# Patient Record
Sex: Male | Born: 1950 | Race: Black or African American | Hispanic: No | Marital: Married | Smoking: Never smoker
Health system: Southern US, Community
[De-identification: ages and names within clinical notes are randomized; demographics above are authoritative.]

## PROBLEM LIST (undated history)

## (undated) DIAGNOSIS — E7800 Pure hypercholesterolemia, unspecified: Secondary | ICD-10-CM

## (undated) DIAGNOSIS — E119 Type 2 diabetes mellitus without complications: Secondary | ICD-10-CM

## (undated) HISTORY — PX: ACHILLES TENDON REPAIR: SUR1153

---

## 2015-07-12 ENCOUNTER — Encounter (HOSPITAL_COMMUNITY): Payer: Self-pay

## 2015-07-12 DIAGNOSIS — E119 Type 2 diabetes mellitus without complications: Secondary | ICD-10-CM | POA: Diagnosis not present

## 2015-07-12 DIAGNOSIS — R109 Unspecified abdominal pain: Secondary | ICD-10-CM | POA: Insufficient documentation

## 2015-07-12 LAB — URINALYSIS, ROUTINE W REFLEX MICROSCOPIC
BILIRUBIN URINE: NEGATIVE
Glucose, UA: 500 mg/dL — AB
HGB URINE DIPSTICK: NEGATIVE
KETONES UR: NEGATIVE mg/dL
Leukocytes, UA: NEGATIVE
NITRITE: NEGATIVE
PROTEIN: NEGATIVE mg/dL
SPECIFIC GRAVITY, URINE: 1.022 (ref 1.005–1.030)
pH: 5 (ref 5.0–8.0)

## 2015-07-12 NOTE — ED Notes (Signed)
Pt has right flank pain and it has been bothering him for a week now. Denies any pain with urination. Wants to get it checked out before it gets worse. Denies any hx of kidney stone.

## 2015-07-12 NOTE — ED Notes (Signed)
Formatting of this note might be different from the original.  Pt has right flank pain and it has been bothering him for a week now. Denies any pain with urination. Wants to get it checked out before it gets worse. Denies any hx of kidney stone.   Electronically signed by Mylinda Latinaingley, Hayley J, RN at 07/12/2015 10:52 PM EST

## 2015-07-13 ENCOUNTER — Emergency Department (HOSPITAL_COMMUNITY): Payer: Managed Care, Other (non HMO)

## 2015-07-13 ENCOUNTER — Emergency Department (HOSPITAL_COMMUNITY)
Admission: EM | Admit: 2015-07-13 | Discharge: 2015-07-13 | Disposition: A | Discharge status: Home / Self Care | Payer: Managed Care, Other (non HMO) | Attending: Emergency Medicine | Admitting: Emergency Medicine

## 2015-07-13 ENCOUNTER — Encounter (HOSPITAL_COMMUNITY): Payer: Self-pay | Admitting: Radiology

## 2015-07-13 DIAGNOSIS — R109 Unspecified abdominal pain: Secondary | ICD-10-CM

## 2015-07-13 HISTORY — DX: Type 2 diabetes mellitus without complications: E11.9

## 2015-07-13 HISTORY — DX: Pure hypercholesterolemia, unspecified: E78.00

## 2015-07-13 LAB — I-STAT CHEM 8, ED
BUN: 16 mg/dL (ref 6–20)
CHLORIDE: 101 mmol/L (ref 101–111)
CREATININE: 0.8 mg/dL (ref 0.61–1.24)
Calcium, Ion: 1.23 mmol/L (ref 1.13–1.30)
Glucose, Bld: 167 mg/dL — ABNORMAL HIGH (ref 65–99)
HEMATOCRIT: 46 % (ref 39.0–52.0)
Hemoglobin: 15.6 g/dL (ref 13.0–17.0)
Potassium: 4 mmol/L (ref 3.5–5.1)
Sodium: 141 mmol/L (ref 135–145)
TCO2: 28 mmol/L (ref 0–100)

## 2015-07-13 MED ORDER — NAPROXEN 500 MG PO TABS: 500.0000 mg | ORAL_TABLET | Freq: Two times a day (BID) | ORAL | Status: AC

## 2015-07-13 NOTE — ED Provider Notes (Signed)
Formatting of this note is different from the original.  CSN: [id number]     Arrival date & time 07/12/15  2234  History    First MD Initiated Contact with Patient 07/13/15 0359      Chief Complaint   Patient presents with   ? Flank Pain     (Consider location/radiation/quality/duration/timing/severity/associated sxs/prior  Treatment)  HPI    This is a 65 year old male with history of diabetes and hyperlipidemia who presents with right flank pain. Patient reports onset of right flank pain 1 week ago. He states that the pain comes and goes. It is achy. It is nonradiating. Currently his pain is 8 out of 10. Nothing seems to make the pain better or worse. He denies any dysuria or hematuria. He denies any nausea, vomiting, abdominal pain, chest pain. He denies any other symptoms. No history of similar pain. No history of kidney stones.    Past Medical History   Diagnosis Date   ? Diabetes mellitus without complication (HCC)    ? High cholesterol      Past Surgical History   Procedure Laterality Date   ? Achilles tendon repair Left      No family history on file.  Social History   Substance Use Topics   ? Smoking status: Never Smoker    ? Smokeless tobacco: None   ? Alcohol Use: No     Review of Systems   Constitutional: Negative for fever.   Respiratory: Negative for chest tightness and shortness of breath.    Cardiovascular: Negative for chest pain.   Gastrointestinal: Negative for nausea, vomiting and abdominal pain.   Genitourinary: Positive for flank pain. Negative for hematuria.   All other systems reviewed and are negative.    Allergies   Sulfa antibiotics    Home Medications     Prior to Admission medications    Medication Sig Start Date End Date Taking? Authorizing Provider   naproxen (NAPROSYN) 500 MG tablet Take 1 tablet (500 mg total) by mouth 2 (two) times daily. 07/13/15   Shon Baton, MD     BP 114/75 mmHg  Pulse 93  Temp(Src) 98 F (36.7 C) (Oral)  Resp 18  SpO2 100%  Physical Exam    Constitutional: He is oriented to person, place, and time. He appears well-developed and well-nourished. No distress.   HENT:   Head: Normocephalic and atraumatic.   Cardiovascular: Normal rate, regular rhythm and normal heart sounds.    No murmur heard.  Pulmonary/Chest: Effort normal and breath sounds normal. No respiratory distress. He has no wheezes.   Abdominal: Soft. Bowel sounds are normal. There is no tenderness. There is no rebound.   Genitourinary:   No CVA tenderness   Musculoskeletal: He exhibits no edema.   Neurological: He is alert and oriented to person, place, and time.   Skin: Skin is warm and dry.   Psychiatric: He has a normal mood and affect.   Nursing note and vitals reviewed.    ED Course   Procedures (including critical care time)  Labs Review  Labs Reviewed   URINALYSIS, ROUTINE W REFLEX MICROSCOPIC (NOT AT [facility]) - Abnormal; Notable for the following:     Glucose, UA 500 (*)     All other components within normal limits   I-STAT CHEM 8, ED - Abnormal; Notable for the following:     Glucose, Bld 167 (*)     All other components within normal limits  Imaging Review  Ct Renal Stone Study    07/13/2015  CLINICAL DATA:  65 year old male with right flank pain. EXAM: CT ABDOMEN AND PELVIS WITHOUT CONTRAST TECHNIQUE: Multidetector CT imaging of the abdomen and pelvis was performed following the standard protocol without IV contrast. COMPARISON:  None. FINDINGS: Evaluation of this exam is limited in the absence of intravenous contrast. The visualized lung bases are clear. No intra-abdominal free air or free fluid. Small gallstone. No no pericholecystic fluid. Ultrasound may provide better evaluation of the gallbladder if clinically indicated. The liver, pancreas, spleen, adrenal glands, kidneys, visualized ureters, and urinary bladder appear unremarkable. The prostate gland is enlarged with median lobe hypertrophy. Correlation with clinical exam recommended. There is moderate stool throughout the  colon. Small scattered colonic diverticula may be present. No evidence of bowel obstruction or inflammation. Normal appendix. The abdominal aorta and IVC appear grossly unremarkable on this noncontrast study. No portal venous gas identified. There is mild haziness of the mesentery with multiple top-normal lymph nodes with a "misty mesentery" appearance. This finding is nonspecific but may be related to underlying inflammatory/infectious etiology. No adenopathy. The abdominal wall soft tissues appear unremarkable. There is degenerative changes of the spine. No acute fracture. IMPRESSION: No hydronephrosis or nephrolithiasis. Constipation. No evidence of bowel obstruction or inflammation. Normal appendix. Cholelithiasis. Nonspecific stranding of the upper mesentery. Electronically Signed   By: Elgie CollardArash  Radparvar M.D.   On: 07/13/2015 05:22     I have personally reviewed and evaluated these images and lab results as part of my medical decision-making.     EKG Interpretation  None        MDM     Final diagnoses:   Flank pain     Patient presents with right-sided flank pain. Ongoing and intermittent for the last week. Denies any associated symptoms. Nontoxic on exam. Afebrile. Vital signs only notable for mild tachycardia at 103. He has no reproducible tenderness on exam and anoscopic tubal appearing. Given the waxing and waning nature of the pain, kidney stones are consideration. No evidence of hematuria. Renal stone study is negative. Screening chem 8 is largely unremarkable with exception of mild hyper glycemia. At this time the etiology of his pain is unknown. He is not taking any medications at home. We'll discharge naproxen and close primary care follow-up. He could have a muscle strain.  Patient was given strict return precautions.    After history, exam, and medical workup I feel the patient has been appropriately medically screened and is safe for discharge home. Pertinent diagnoses were discussed with the  patient. Patient was given return precautions.    Shon Batonourtney F Horton, MD  07/13/15 [phone]  Electronically signed by Shon BatonHorton, Courtney F, MD at 07/13/2015  6:12 AM EST

## 2015-07-13 NOTE — Discharge Instructions (Signed)
Flank Pain °Flank pain refers to pain that is located on the side of the body between the upper abdomen and the back. The pain may occur over a short period of time (acute) or may be long-term or reoccurring (chronic). It may be mild or severe. Flank pain can be caused by many things. °CAUSES  °Some of the more common causes of flank pain include: °· Muscle strains.   °· Muscle spasms.   °· A disease of your spine (vertebral disk disease).   °· A lung infection (pneumonia).   °· Fluid around your lungs (pulmonary edema).   °· A kidney infection.   °· Kidney stones.   °· A very painful skin rash caused by the chickenpox virus (shingles).   °· Gallbladder disease.   °HOME CARE INSTRUCTIONS  °Home care will depend on the cause of your pain. In general, °· Rest as directed by your caregiver. °· Drink enough fluids to keep your urine clear or pale yellow. °· Only take over-the-counter or prescription medicines as directed by your caregiver. Some medicines may help relieve the pain. °· Tell your caregiver about any changes in your pain. °· Follow up with your caregiver as directed. °SEEK IMMEDIATE MEDICAL CARE IF:  °· Your pain is not controlled with medicine.   °· You have new or worsening symptoms. °· Your pain increases.   °· You have abdominal pain.   °· You have shortness of breath.   °· You have persistent nausea or vomiting.   °· You have swelling in your abdomen.   °· You feel faint or pass out.   °· You have blood in your urine. °· You have a fever or persistent symptoms for more than 2-3 days. °· You have a fever and your symptoms suddenly get worse. °MAKE SURE YOU:  °· Understand these instructions. °· Will watch your condition. °· Will get help right away if you are not doing well or get worse. °  °This information is not intended to replace advice given to you by your health care provider. Make sure you discuss any questions you have with your health care provider. °  °Document Released: 08/08/2005 Document  Revised: 03/11/2012 Document Reviewed: 01/30/2012 °Elsevier Interactive Patient Education ©2016 [employer] ° °

## 2015-07-13 NOTE — ED Provider Notes (Signed)
CSN: [id number]     Arrival date & time 07/12/15  2234 History   First MD Initiated Contact with Patient 07/13/15 0359     Chief Complaint  Patient presents with  . Flank Pain     (Consider location/radiation/quality/duration/timing/severity/associated sxs/prior Treatment) HPI  This is a 65 year old male with history of diabetes and hyperlipidemia who presents with right flank pain. Patient reports onset of right flank pain 1 week ago. He states that the pain comes and goes. It is achy. It is nonradiating. Currently his pain is 8 out of 10. Nothing seems to make the pain better or worse. He denies any dysuria or hematuria. He denies any nausea, vomiting, abdominal pain, chest pain. He denies any other symptoms. No history of similar pain. No history of kidney stones.  Past Medical History  Diagnosis Date  . Diabetes mellitus without complication (HCC)   . High cholesterol    Past Surgical History  Procedure Laterality Date  . Achilles tendon repair Left    No family history on file. Social History  Substance Use Topics  . Smoking status: Never Smoker   . Smokeless tobacco: None  . Alcohol Use: No    Review of Systems  Constitutional: Negative for fever.  Respiratory: Negative for chest tightness and shortness of breath.   Cardiovascular: Negative for chest pain.  Gastrointestinal: Negative for nausea, vomiting and abdominal pain.  Genitourinary: Positive for flank pain. Negative for hematuria.  All other systems reviewed and are negative.     Allergies  Sulfa antibiotics  Home Medications   Prior to Admission medications   Medication Sig Start Date End Date Taking? Authorizing Provider  naproxen (NAPROSYN) 500 MG tablet Take 1 tablet (500 mg total) by mouth 2 (two) times daily. 07/13/15   Shon Batonourtney F Tanylah Schnoebelen, MD   BP 114/75 mmHg  Pulse 93  Temp(Src) 98 F (36.7 C) (Oral)  Resp 18  SpO2 100% Physical Exam  Constitutional: He is oriented to person, place, and  time. He appears well-developed and well-nourished. No distress.  HENT:  Head: Normocephalic and atraumatic.  Cardiovascular: Normal rate, regular rhythm and normal heart sounds.   No murmur heard. Pulmonary/Chest: Effort normal and breath sounds normal. No respiratory distress. He has no wheezes.  Abdominal: Soft. Bowel sounds are normal. There is no tenderness. There is no rebound.  Genitourinary:  No CVA tenderness  Musculoskeletal: He exhibits no edema.  Neurological: He is alert and oriented to person, place, and time.  Skin: Skin is warm and dry.  Psychiatric: He has a normal mood and affect.  Nursing note and vitals reviewed.   ED Course  Procedures (including critical care time) Labs Review Labs Reviewed  URINALYSIS, ROUTINE W REFLEX MICROSCOPIC (NOT AT [facility]) - Abnormal; Notable for the following:    Glucose, UA 500 (*)    All other components within normal limits  I-STAT CHEM 8, ED - Abnormal; Notable for the following:    Glucose, Bld 167 (*)    All other components within normal limits    Imaging Review Ct Renal Stone Study  07/13/2015  CLINICAL DATA:  65 year old male with right flank pain. EXAM: CT ABDOMEN AND PELVIS WITHOUT CONTRAST TECHNIQUE: Multidetector CT imaging of the abdomen and pelvis was performed following the standard protocol without IV contrast. COMPARISON:  None. FINDINGS: Evaluation of this exam is limited in the absence of intravenous contrast. The visualized lung bases are clear. No intra-abdominal free air or free fluid. Small gallstone. No no  pericholecystic fluid. Ultrasound may provide better evaluation of the gallbladder if clinically indicated. The liver, pancreas, spleen, adrenal glands, kidneys, visualized ureters, and urinary bladder appear unremarkable. The prostate gland is enlarged with median lobe hypertrophy. Correlation with clinical exam recommended. There is moderate stool throughout the colon. Small scattered colonic diverticula may be  present. No evidence of bowel obstruction or inflammation. Normal appendix. The abdominal aorta and IVC appear grossly unremarkable on this noncontrast study. No portal venous gas identified. There is mild haziness of the mesentery with multiple top-normal lymph nodes with a "misty mesentery" appearance. This finding is nonspecific but may be related to underlying inflammatory/infectious etiology. No adenopathy. The abdominal wall soft tissues appear unremarkable. There is degenerative changes of the spine. No acute fracture. IMPRESSION: No hydronephrosis or nephrolithiasis. Constipation. No evidence of bowel obstruction or inflammation. Normal appendix. Cholelithiasis. Nonspecific stranding of the upper mesentery. Electronically Signed   By: Elgie Collard M.D.   On: 07/13/2015 05:22   I have personally reviewed and evaluated these images and lab results as part of my medical decision-making.   EKG Interpretation None      MDM   Final diagnoses:  Flank pain    Patient presents with right-sided flank pain. Ongoing and intermittent for the last week. Denies any associated symptoms. Nontoxic on exam. Afebrile. Vital signs only notable for mild tachycardia at 103. He has no reproducible tenderness on exam and anoscopic tubal appearing. Given the waxing and waning nature of the pain, kidney stones are consideration. No evidence of hematuria. Renal stone study is negative. Screening chem 8 is largely unremarkable with exception of mild hyper glycemia. At this time the etiology of his pain is unknown. He is not taking any medications at home. We'll discharge naproxen and close primary care follow-up. He could have a muscle strain.  Patient was given strict return precautions.  After history, exam, and medical workup I feel the patient has been appropriately medically screened and is safe for discharge home. Pertinent diagnoses were discussed with the patient. Patient was given return  precautions.     Shon Baton, MD 07/13/15 [phone]

## 2016-09-08 IMAGING — CT CT RENAL STONE PROTOCOL
2 of 4 series · 15 of 46 positions shown, 17 images · non-contrast
Comparison: None.

CLINICAL DATA: 64-year-old male with right flank pain.

EXAM:
CT ABDOMEN AND PELVIS WITHOUT CONTRAST
TECHNIQUE: Multidetector CT imaging of the abdomen and pelvis was performed
following the standard protocol without IV contrast.

[Series 2: renal stone 5mm · axial · 0.82mm/px · z∈[-544,-139]mm · 12 of 89 slices shown, 14 images]
[im 4/89  soft-tissue]
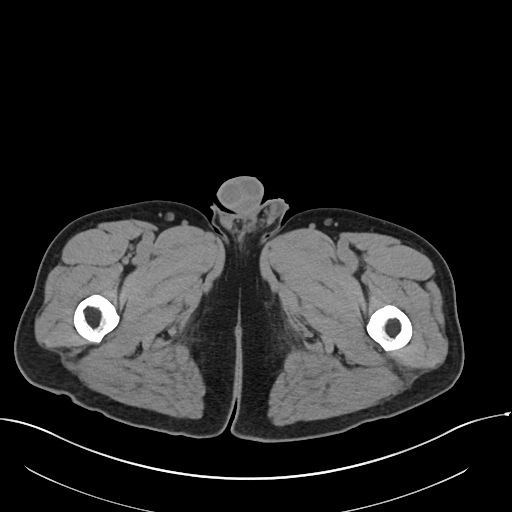
[im 4/89  bone]
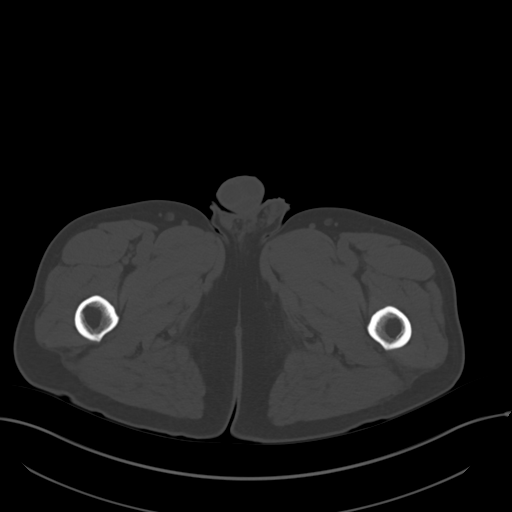
[im 12/89  soft-tissue]
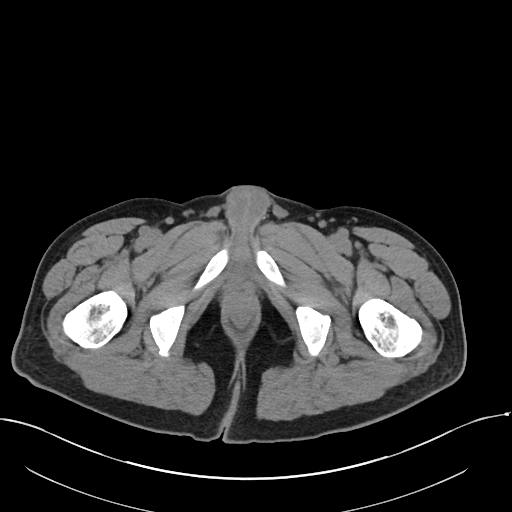
[im 19/89  soft-tissue]
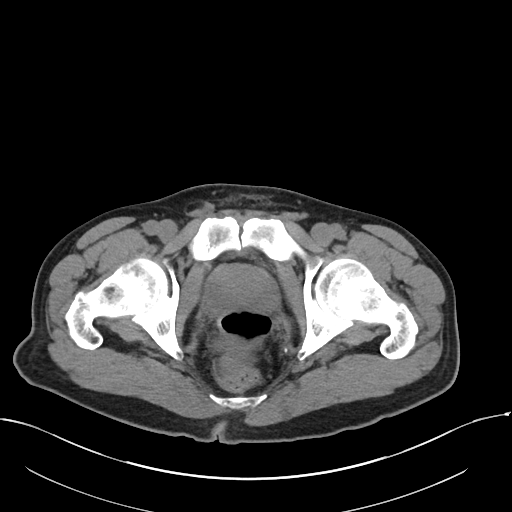
[im 26/89  soft-tissue]
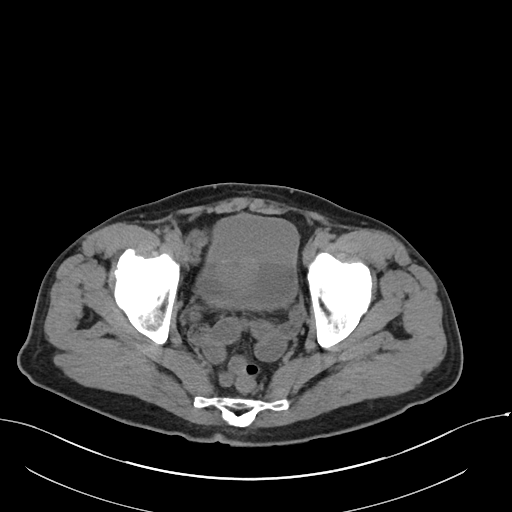
[im 34/89  soft-tissue]
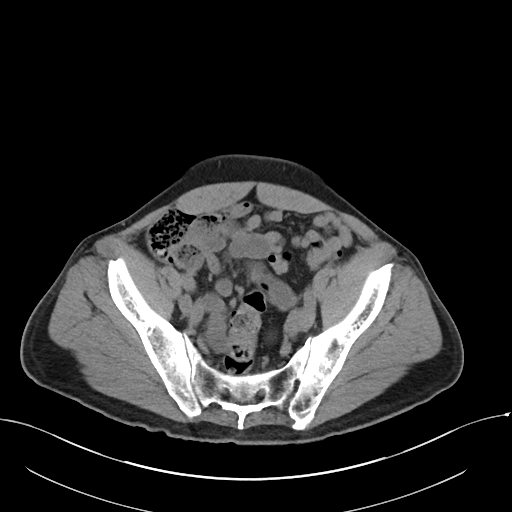
[im 41/89  soft-tissue]
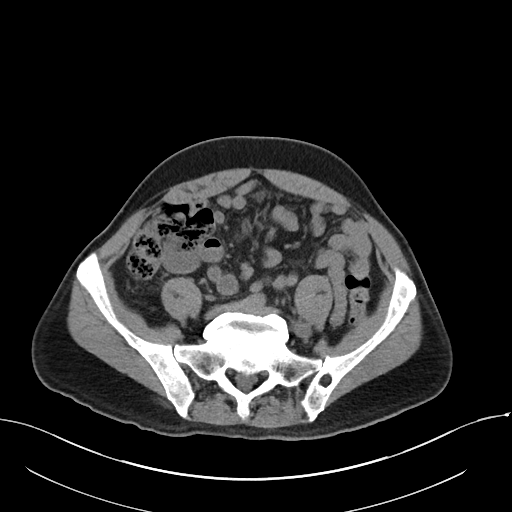
[im 48/89  soft-tissue]
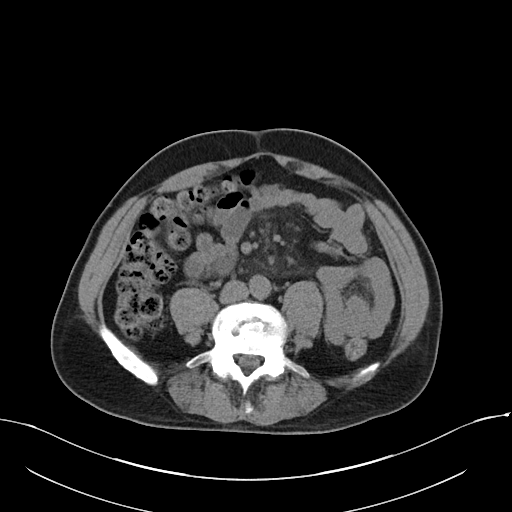
[im 56/89  soft-tissue]
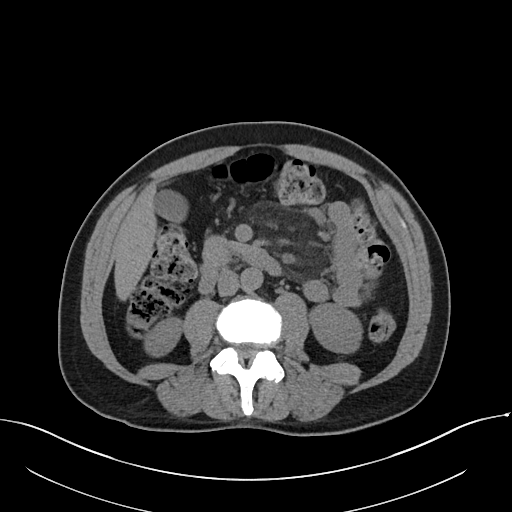
[im 63/89  soft-tissue]
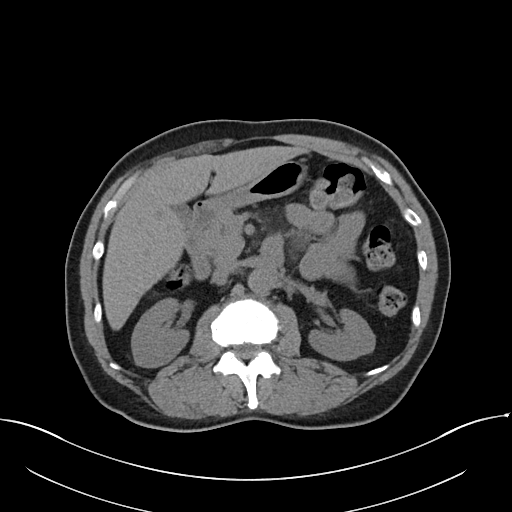
[im 63/89  bone]
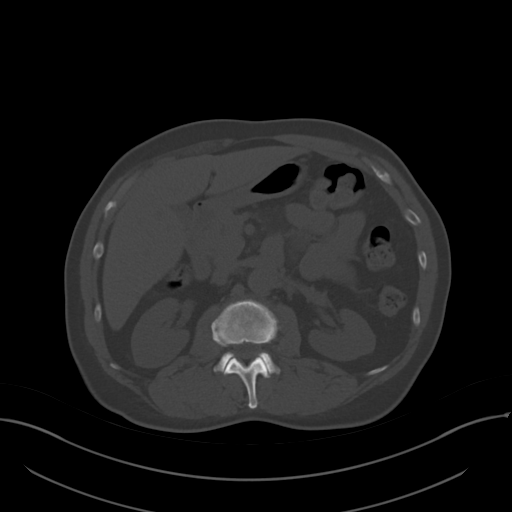
[im 70/89  soft-tissue]
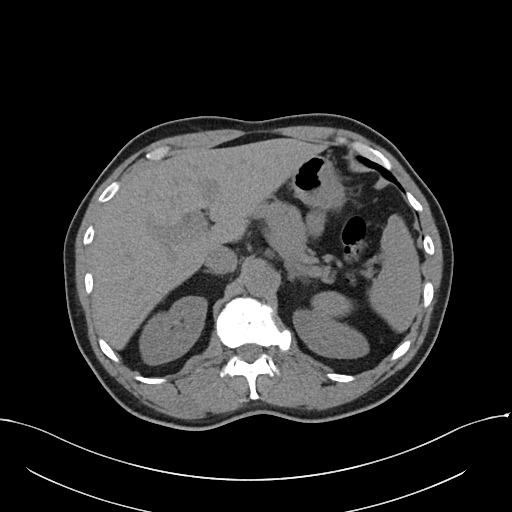
[im 78/89  soft-tissue]
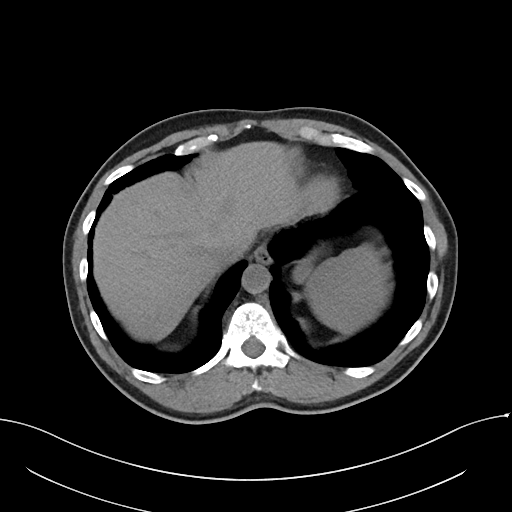
[im 85/89  soft-tissue]
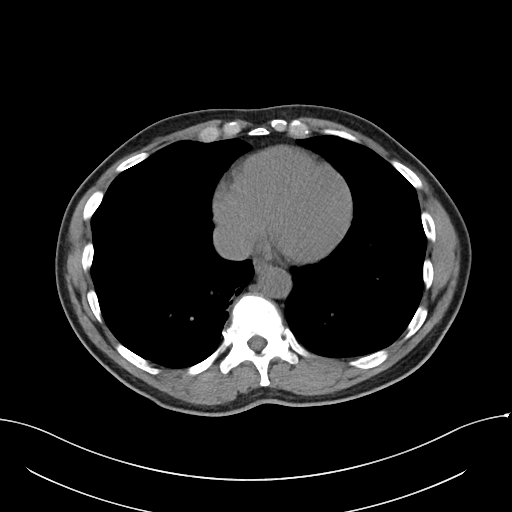

[Series 5: renal stone 3.0 cor · coronal · 0.65mm/px · 3 of 84 slices shown]
[im 28/84  soft-tissue]
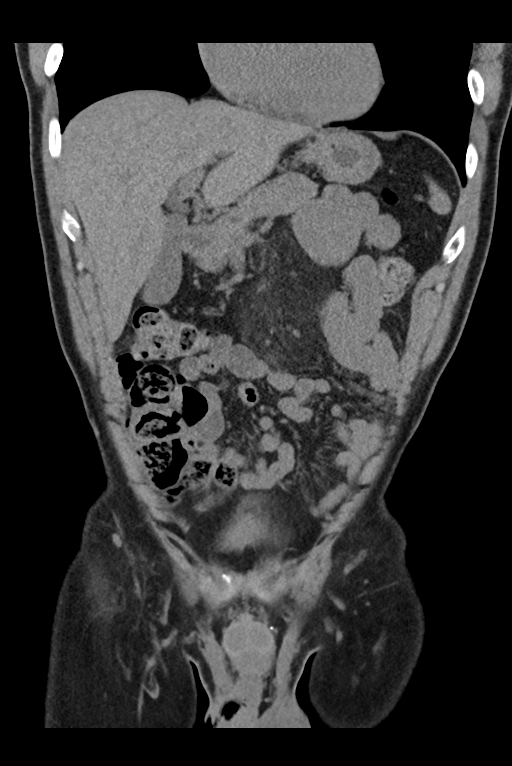
[im 37/84  soft-tissue]
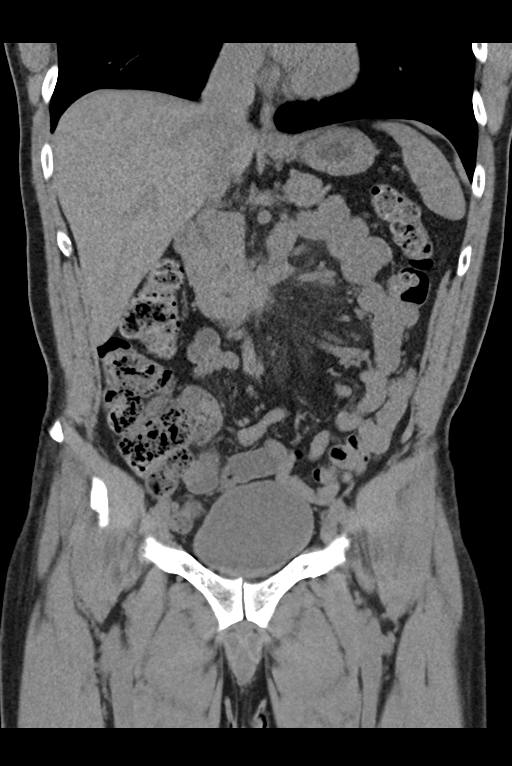
[im 47/84  soft-tissue]
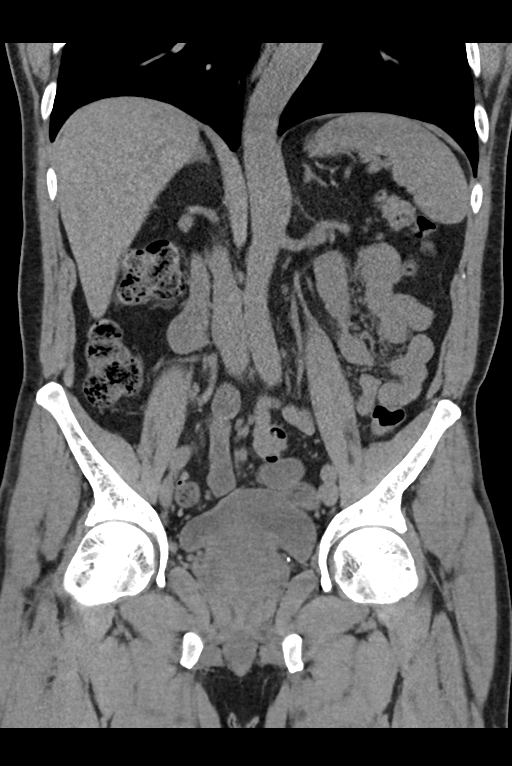

[15 of 46 positions shown; findings below may reference images not displayed]

FINDINGS: Evaluation of this exam is limited in the absence of intravenous
contrast.

The visualized lung bases are clear. No intra-abdominal free air or
free fluid.

Small gallstone. No no pericholecystic fluid. Ultrasound may provide
better evaluation of the gallbladder if clinically indicated. The
liver, pancreas, spleen, adrenal glands, kidneys, visualized
ureters, and urinary bladder appear unremarkable. The prostate gland
is enlarged with median lobe hypertrophy. Correlation with clinical
exam recommended.

There is moderate stool throughout the colon. Small scattered
colonic diverticula may be present. No evidence of bowel obstruction
or inflammation. Normal appendix.

The abdominal aorta and IVC appear grossly unremarkable on this
noncontrast study. No portal venous gas identified. There is mild
haziness of the mesentery with multiple top-normal lymph nodes with
a "misty mesentery" appearance. This finding is nonspecific but may
be related to underlying inflammatory/infectious etiology. No
adenopathy.

The abdominal wall soft tissues appear unremarkable. There is
degenerative changes of the spine. No acute fracture.
IMPRESSION: No hydronephrosis or nephrolithiasis.

Constipation. No evidence of bowel obstruction or inflammation.
Normal appendix.

Cholelithiasis.

Nonspecific stranding of the upper mesentery.

## 2020-05-15 NOTE — Progress Notes (Signed)
Formatting of this note might be different from the original.  CC: Pain of the Left Knee and Pain of the Right Knee    HPI: 69 y.o. 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 and left total knee arthroplasty complaining of pain-generally in the operative area.  The pain is not constant.    Nighttime and days with car rides > 2 hours are most problematic.  Voltaren gel helps throughout the night.  Naproxen didn't help. Ibuprofen 800 with tylenol didn't help.     Images:      Exam:  69 y.o. male   Walking without a limp, no assistive device, ROM:  0, -120, no effusion present, coronal stability at 0, 30, 60 is excellent to both varus and valgus stress and well-healed midline incision    Assessment:  bilateral total knee with nighttime pain and some pain with prolonged driving, not quite a year out. .    Plan:  Follow up at 1 year anniversary with X-rays on arrival and Will add gabapentin for now.  Can consider changing to a prescription NSAID as a next step.  Will continue voltaren gel.   Answers for HPI/ROS submitted by the patient on 05/12/2020  Onset: more than 1 month ago  Frequency: 2 to 4 times per day  Progression since onset: unchanged  Aggravating factors: lying down, moving  Treatments tried: acetaminophen      Electronically signed by Brantley Persons, MD at 05/15/2020 10:29 AM EST

## 2021-12-26 NOTE — Anesthesia Pre-Procedure Evaluation (Signed)
Formatting of this note is different from the original.    Relevant Problems   No relevant active problems     Anesthesia Evaluation    ROS/Med Hx     Airway   Mallampati: II  TM distance: >3 FB    Neck ROM: full  Dental      Pulmonary - negative ROS and normal exam   Cardiovascular - normal exam  (+) hypertensionhyperlipidemia    Rhythm: regular  Rate: normal    Neuro/Psych - negative ROS      GI/Hepatic/Renal - negative ROS     Endo/Other    (+) diabetes mellitus,   Abdominal            Anesthesia Plan  Anesthetic plan and risks discussed with patient.  ASA 2     MAC     Plan discussed with CRNA.    intravenous induction     Date of last liquid: 12/25/21   Time of last liquid: 1900   Date of last solid: 12/25/21   Time of last solid: 1900     BP: 109/75  Heart Rate: 89  Resp: 18  Temp: 97.4 F (36.3 C)  SpO2: 99 %  Weight: 76 kg (167 lb 9.6 oz)  BMI (Calculated): 22.7  Electronically signed by Milagros Reap, MD at 12/26/2021 11:03 AM EDT

## 2021-12-26 NOTE — Anesthesia Post-Procedure Evaluation (Signed)
Formatting of this note might be different from the original.      Patient: Eric Moreno  Procedure(s):  RIGHT EYE PHACO W/ IOL   Anesthesia type: MAC    Patient location:  PACU  Patient participation:  Patient able to participate in this evaluation at age appropriate level.    BP: 115/72  Heart Rate: 77  Resp: 16  Temp: 97.4 F (36.3 C)  SpO2: 97 %  Weight: 76 kg (167 lb 9.6 oz)  BMI (Calculated): 22.7    Post vital signs:   stable  Level of consciousness:   awake, alert and oriented    Post-anesthesia pain:   adequate analgesia  Airway patency:   patent  Respiratory:   unassisted, respiration function adequate, spontaneous ventilation  Cardiovascular:   stable, blood pressure acceptable and heart rate acceptable  Hydration:   adequate hydration  Temperature: temperature adequate >96.79F  PONV:  nausea and vomiting controlled  Regional anesthesia: no block performed    Notable Events:     No Anesthesia notable events documented.    Electronically signed by Milagros Reap, MD at 12/26/2021 12:08 PM EDT

## 2022-07-05 ENCOUNTER — Inpatient Hospital Stay: Admit: 2022-07-05 | Discharge: 2022-07-05 | Disposition: A | Discharge status: Home / Self Care | Payer: MEDICARE

## 2022-07-05 DIAGNOSIS — R55 Syncope and collapse: Secondary | ICD-10-CM

## 2022-07-05 DIAGNOSIS — E860 Dehydration: Secondary | ICD-10-CM

## 2022-07-05 LAB — CBC WITH AUTO DIFFERENTIAL
Absolute Immature Granulocyte: 0 10*3/uL (ref 0.00–0.04)
Basophils %: 0 % (ref 0–2)
Basophils Absolute: 0 10*3/uL (ref 0.0–0.1)
Eosinophils %: 1 % (ref 0–5)
Eosinophils Absolute: 0.1 10*3/uL (ref 0.0–0.4)
Hematocrit: 35.9 % — ABNORMAL LOW (ref 36.0–48.0)
Hemoglobin: 11.8 g/dL — ABNORMAL LOW (ref 13.0–16.0)
Immature Granulocytes: 0 % (ref 0.0–0.5)
Lymphocytes %: 16 % — ABNORMAL LOW (ref 21–52)
Lymphocytes Absolute: 1.5 10*3/uL (ref 0.9–3.6)
MCH: 25.7 PG (ref 24.0–34.0)
MCHC: 32.9 g/dL (ref 31.0–37.0)
MCV: 78 FL (ref 78.0–100.0)
MPV: 10.7 FL (ref 9.2–11.8)
Monocytes %: 7 % (ref 3–10)
Monocytes Absolute: 0.7 10*3/uL (ref 0.05–1.2)
Neutrophils %: 75 % — ABNORMAL HIGH (ref 40–73)
Neutrophils Absolute: 6.9 10*3/uL (ref 1.8–8.0)
Nucleated RBCs: 0 PER 100 WBC
Platelets: 201 10*3/uL (ref 135–420)
RBC: 4.6 M/uL (ref 4.35–5.65)
RDW: 15.4 % — ABNORMAL HIGH (ref 11.6–14.5)
WBC: 9.2 10*3/uL (ref 4.6–13.2)
nRBC: 0 10*3/uL (ref 0.00–0.01)

## 2022-07-05 LAB — COMPREHENSIVE METABOLIC PANEL
ALT: 12 U/L — ABNORMAL LOW (ref 16–61)
AST: 8 U/L — ABNORMAL LOW (ref 10–38)
Albumin/Globulin Ratio: 1.2 (ref 0.8–1.7)
Albumin: 3.7 g/dL (ref 3.4–5.0)
Alk Phosphatase: 130 U/L — ABNORMAL HIGH (ref 45–117)
Anion Gap: 5 mmol/L (ref 3.0–18)
BUN: 19 MG/DL — ABNORMAL HIGH (ref 7.0–18)
Bun/Cre Ratio: 12 (ref 12–20)
CO2: 28 mmol/L (ref 21–32)
Calcium: 9 MG/DL (ref 8.5–10.1)
Chloride: 105 mmol/L (ref 100–111)
Creatinine: 1.56 MG/DL — ABNORMAL HIGH (ref 0.6–1.3)
Est, Glom Filt Rate: 47 mL/min/{1.73_m2} — ABNORMAL LOW (ref >60)
Globulin: 3 g/dL (ref 2.0–4.0)
Glucose: 261 mg/dL — ABNORMAL HIGH (ref 74–99)
Potassium: 4.2 mmol/L (ref 3.5–5.5)
Sodium: 138 mmol/L (ref 136–145)
Total Bilirubin: 0.6 MG/DL (ref 0.2–1.0)
Total Protein: 6.7 g/dL (ref 6.4–8.2)

## 2022-07-05 LAB — URINALYSIS
Bilirubin Urine: NEGATIVE
Blood, Urine: NEGATIVE
Glucose, UA: >1000 mg/dL — AB
Nitrite, Urine: NEGATIVE
Specific Gravity, UA: 1.018 (ref 1.005–1.030)
Urobilinogen, Urine: 0.2 EU/dL (ref 0.2–1.0)
pH, Urine: 5 (ref 5.0–8.0)

## 2022-07-05 LAB — EKG 12-LEAD
Atrial Rate: 82 {beats}/min
P Axis: 58 degrees
P-R Interval: 162 ms
Q-T Interval: 382 ms
QRS Duration: 86 ms
QTc Calculation (Bazett): 446 ms
R Axis: -16 degrees
T Axis: 35 degrees
Ventricular Rate: 82 {beats}/min

## 2022-07-05 LAB — URINALYSIS, MICRO
BACTERIA, URINE: NEGATIVE /hpf
Hyaline Casts, UA: 0 /lpf (ref 0–2)
RBC, UA: NEGATIVE /hpf (ref 0–5)
WBC, UA: 0 /hpf (ref 0–4)

## 2022-07-05 LAB — URINE DRUG SCREEN
Amphetamine, Urine: NEGATIVE
Barbiturates, Urine: NEGATIVE
Benzodiazepines, Urine: NEGATIVE
Cocaine, Urine: NEGATIVE
Methadone, Urine: NEGATIVE
Opiates, Urine: NEGATIVE
PCP, Urine: NEGATIVE
THC, TH-Cannabinol, Urine: NEGATIVE

## 2022-07-05 LAB — MAGNESIUM: Magnesium: 1.3 mg/dL — ABNORMAL LOW (ref 1.6–2.6)

## 2022-07-05 LAB — TROPONIN: Troponin, High Sensitivity: 9 ng/L (ref 0–78)

## 2022-07-05 MED ORDER — MAGNESIUM SULFATE 2000 MG/50 ML IVPB PREMIX
2 GM/50ML | INTRAVENOUS | Status: AC
Start: 2022-07-05 — End: 2022-07-05
  Administered 2022-07-05: 10:00:00 2000 mg via INTRAVENOUS

## 2022-07-05 MED ORDER — NALOXONE HCL 0.4 MG/ML IJ SOLN
0.4 MG/ML | INTRAMUSCULAR | Status: AC
Start: 2022-07-05 — End: 2022-07-05
  Administered 2022-07-05: 08:00:00 0.4 mg via INTRAVENOUS

## 2022-07-05 MED ORDER — LACTATED RINGERS IV BOLUS
Freq: Once | INTRAVENOUS | Status: AC
Start: 2022-07-05 — End: 2022-07-05
  Administered 2022-07-05: 10:00:00 2000 mL via INTRAVENOUS

## 2022-07-05 MED FILL — MAGNESIUM SULFATE 2 GM/50ML IV SOLN: 2 GM/50ML | INTRAVENOUS | Qty: 50

## 2022-07-05 MED FILL — LACTATED RINGERS IV SOLN: INTRAVENOUS | Qty: 2000

## 2022-07-05 MED FILL — NALOXONE HCL 0.4 MG/ML IJ SOLN: 0.4 MG/ML | INTRAMUSCULAR | Qty: 1

## 2022-07-05 NOTE — ED Provider Notes (Signed)
EMERGENCY DEPARTMENT HISTORY AND PHYSICAL EXAM      Date: 07/05/2022  Patient Name: Eric Moreno      History of Presenting Illness     No chief complaint on file.      Location/Duration/Severity/Modifying factors   No chief complaint on file.      HPI:  Eric Moreno is a 72 y.o. male with PMH significant for hypertension, hyperlipidemia, diabetes brought in by EMS for syncope.  Patient states he was at work when this happened.  Became lightheaded.  Did not fall or hit his head.  Denies any chest pain, shortness of breath, focal numbness or weakness, blurry vision.  Patient does report feeling slightly nauseous otherwise he is asymptomatic at this time.  PCP: No primary care provider on file.    No current facility-administered medications for this encounter.     No current outpatient medications on file.       Past History     Past Medical History:  No past medical history on file.    Past Surgical History:  No past surgical history on file.    Family History:  No family history on file.    Social History:       Allergies:  No Known Allergies      Physical Exam     General: Patient is awake but mildly lethargic.  Wakes to soft voice, resting comfortably in no acute distress.  Eyes: No scleral injection.  Cardiovascular: RRR, no murmurs, warm, well-perfused extremities. 2+ peripheral pulses.  Respiratory: Normal respiratory effort. Lung sounds clear to auscultation.  GI: soft, non-tender, non-distended.  Skin: Warm, dry, and intact. No visible rash or jaundice.  MSK: No peripheral edema or extremity deformity noted.  Neuro: The patient is alert and oriented, no gross motor defects noted.   Psych: Appropriate mood and affect.        Lab and Diagnostic Study Results     Labs -  Recent Results (from the past 24 hour(s))   CBC with Auto Differential    Collection Time: 07/05/22  2:52 AM   Result Value Ref Range    WBC 9.2 4.6 - 13.2 K/uL    RBC 4.60 4.35 - 5.65 M/uL    Hemoglobin 11.8 (L) 13.0 - 16.0 g/dL     Hematocrit 35.9 (L) 36.0 - 48.0 %    MCV 78.0 78.0 - 100.0 FL    MCH 25.7 24.0 - 34.0 PG    MCHC 32.9 31.0 - 37.0 g/dL    RDW 15.4 (H) 11.6 - 14.5 %    Platelets 201 135 - 420 K/uL    MPV 10.7 9.2 - 11.8 FL    Nucleated RBCs 0.0 0 PER 100 WBC    nRBC 0.00 0.00 - 0.01 K/uL    Neutrophils % 75 (H) 40 - 73 %    Lymphocytes % 16 (L) 21 - 52 %    Monocytes % 7 3 - 10 %    Eosinophils % 1 0 - 5 %    Basophils % 0 0 - 2 %    Immature Granulocytes 0 0.0 - 0.5 %    Neutrophils Absolute 6.9 1.8 - 8.0 K/UL    Lymphocytes Absolute 1.5 0.9 - 3.6 K/UL    Monocytes Absolute 0.7 0.05 - 1.2 K/UL    Eosinophils Absolute 0.1 0.0 - 0.4 K/UL    Basophils Absolute 0.0 0.0 - 0.1 K/UL    Absolute Immature Granulocyte 0.0 0.00 - 0.04 K/UL  Differential Type AUTOMATED     Comprehensive Metabolic Panel    Collection Time: 07/05/22  2:52 AM   Result Value Ref Range    Sodium 138 136 - 145 mmol/L    Potassium 4.2 3.5 - 5.5 mmol/L    Chloride 105 100 - 111 mmol/L    CO2 28 21 - 32 mmol/L    Anion Gap 5 3.0 - 18 mmol/L    Glucose 261 (H) 74 - 99 mg/dL    BUN 19 (H) 7.0 - 18 MG/DL    Creatinine 1.88 (H) 0.6 - 1.3 MG/DL    Bun/Cre Ratio 12 12 - 20      Est, Glom Filt Rate 47 (L) >60 ml/min/1.40m2    Calcium 9.0 8.5 - 10.1 MG/DL    Total Bilirubin 0.6 0.2 - 1.0 MG/DL    ALT 12 (L) 16 - 61 U/L    AST 8 (L) 10 - 38 U/L    Alk Phosphatase 130 (H) 45 - 117 U/L    Total Protein 6.7 6.4 - 8.2 g/dL    Albumin 3.7 3.4 - 5.0 g/dL    Globulin 3.0 2.0 - 4.0 g/dL    Albumin/Globulin Ratio 1.2 0.8 - 1.7     Troponin    Collection Time: 07/05/22  2:52 AM   Result Value Ref Range    Troponin, High Sensitivity 9 0 - 78 ng/L   Magnesium    Collection Time: 07/05/22  2:52 AM   Result Value Ref Range    Magnesium 1.3 (L) 1.6 - 2.6 mg/dL       Radiologic Studies -   No orders to display         Procedures and Critical Care     Performed by: Wylie Hail, MD    Procedures     Wylie Hail, MD    Medical Decision Making and ED Course   - I am the first and  primary provider for this patient AND AM THE PRIMARY PROVIDER OF RECORD.    - I reviewed the vital signs, available nursing notes, past medical history, past surgical history, family history and social history.    - Initial assessment performed. The patients presenting problems have been discussed, and the staff are in agreement with the care plan formulated and outlined with them.  I have encouraged them to ask questions as they arise throughout their visit.    Vital Signs-Reviewed the patient's vital signs.    Patient Vitals for the past 12 hrs:   Temp Pulse Resp BP SpO2   07/05/22 0440 97.9 F (36.6 C) 93 14 127/79 92 %   07/05/22 0415 -- 93 14 124/79 92 %   07/05/22 0400 -- 88 15 (!) 117/104 99 %   07/05/22 0345 -- 85 14 110/83 98 %   07/05/22 0330 -- 86 19 115/70 100 %   07/05/22 0315 -- 85 22 104/65 97 %   07/05/22 0311 97.9 F (36.6 C) -- -- -- --   07/05/22 0301 -- 75 12 101/61 99 %   07/05/22 0245 -- 78 14 93/63 98 %   07/05/22 0241 -- 80 14 99/64 92 %   07/05/22 0230 -- 80 18 (!) 92/59 93 %         Provider Notes (Medical Decision Making):   ED Course as of 07/05/22 0552   Fri Jul 05, 2022   0302 Ecg @ 02:57 - Nsr rate 82, normal axis, normal intervals, no acute ischemic changes.  [  DW]   3710 Pt awake, alert, oriented. States he feels fine now after fluids and magnesium. Pt requesting to go home.  [DW]      ED Course User Index  [DW] Sue Lush, MD       Initial Differential Diagnosis: syncope, arrhythmia, electrolyte derangement    Clinical presentation most consistent with syncope, hypomagnesemia. .    Documentation/Prior Results Review:  Nursing notes    Social Determinants of Health: None identified    The patient will be discharged home. The patient was reassured that these symptoms do not appear to represent a serious or life threatening condition at this time. Warning signs of worsening condition were discusses and understood by the patient. Based on patient's age, coexisting illness, exam  and the results of this ED evaluation, the decision to treat as an outpatient was made. Based on the information available at time of discharge, acute pathology requiring immediate intervention was deemed relative unlikely. While it is impossible to completely exclude the possibility of underlying serious disease or worsening condition, I feel the relative likelihood is extremely low. I discussed this uncertainty with the patient and/or family member/caregiver, who understood ED evaluation and treatment and felt comfortable with the outpatient treatment plan. All questions regarding care, test results and follow up were answered. At discharge, pt looked well, nontoxic, no distress, and is good candidate for outpatient follow up. They understand that they should return to the Emergency Department for any new or worsening symptoms. I stressed the importance of follow up for repeat assessment and possibly further evaluation/treatment.        Meds Given in ED:  Medications   naloxone [facility]) injection 0.4 mg (0.4 mg IntraVENous Given 07/05/22 0327)   magnesium sulfate 2000 mg in 50 mL IVPB premix (2,000 mg IntraVENous New Bag 07/05/22 0435)   lactated ringers bolus bolus 2,000 mL (2,000 mLs IntraVENous New Bag 07/05/22 0434)       Final Diagnosis:  1. Syncope and collapse    2. Dehydration    3. Hypomagnesemia        Disposition: Discharge  Destination: Home    Discharge Rx:   New Prescriptions    No medications on file         Dictation disclaimer: Please note that this dictation was completed with Dragon, the computer voice recognition software. Quite often unanticipated grammatical, syntax, homophones, and other interpretive errors are inadvertently transcribed by the computer software. Please disregard these errors. Please excuse any errors that have escaped final proofreading.     Sue Lush MD  Emergency Physician  [facility]            Sue Lush, MD  07/05/22 [phone]

## 2022-07-05 NOTE — ED Notes (Signed)
Medication given with no visible reaction from the pt. Pt requested more blankets for comfort. Given blankets. Pt awakes easily and could answer questions. Not in distress at this time

## 2022-07-05 NOTE — ED Triage Notes (Signed)
Per EMS , patient had a syncopal episode while at work.
